# Patient Record
Sex: Female | Born: 1970 | Race: White | Hispanic: No | Smoking: Never smoker
Health system: Southern US, Community
[De-identification: ages and names within clinical notes are randomized; demographics above are authoritative.]

---

## 2004-07-10 ENCOUNTER — Emergency Department: Payer: Self-pay | Admitting: Emergency Medicine

## 2004-09-23 ENCOUNTER — Emergency Department: Payer: Self-pay | Admitting: Emergency Medicine

## 2004-10-19 ENCOUNTER — Emergency Department: Payer: Self-pay | Admitting: Internal Medicine

## 2004-10-24 ENCOUNTER — Emergency Department: Payer: Self-pay | Admitting: Emergency Medicine

## 2004-12-16 ENCOUNTER — Observation Stay: Payer: Self-pay | Admitting: Surgery

## 2005-11-21 ENCOUNTER — Emergency Department: Payer: Self-pay | Admitting: Emergency Medicine

## 2006-01-02 ENCOUNTER — Emergency Department: Payer: Self-pay | Admitting: Emergency Medicine

## 2006-08-06 IMAGING — US ABDOMEN ULTRASOUND
1 series · 17 of 25 positions shown · non-contrast
Comparison: none

REASON FOR EXAM: Abdominal pain
COMMENTS:

[Series 1: abdomen ultrasound · 17 of 51 slices shown]
[im 1/51]
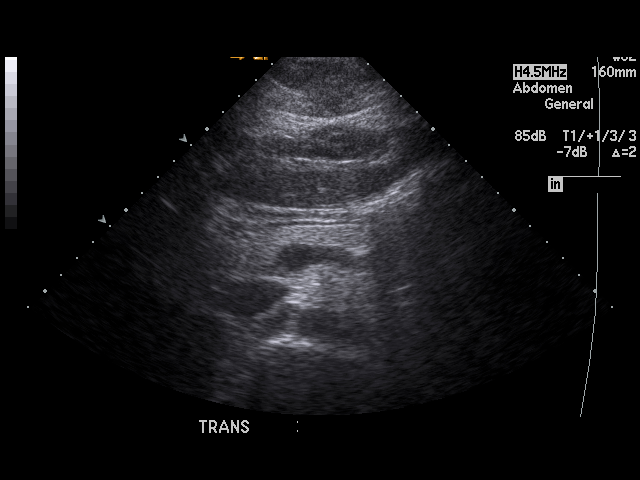
[im 5/51]
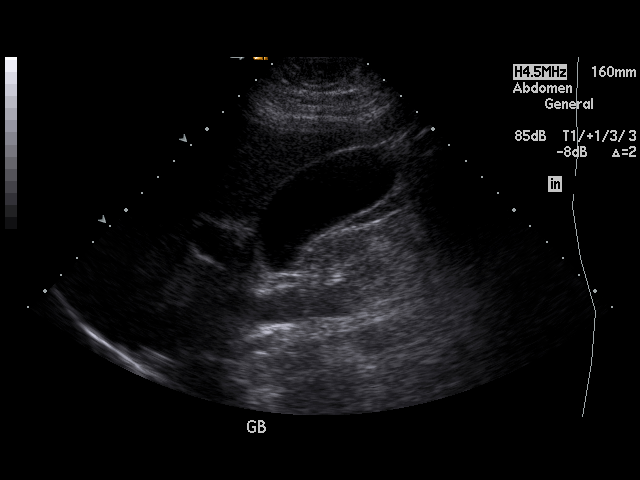
[im 7/51]
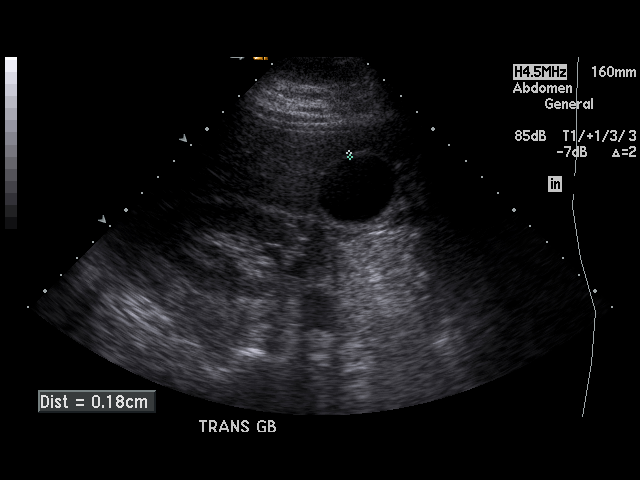
[im 11/51]
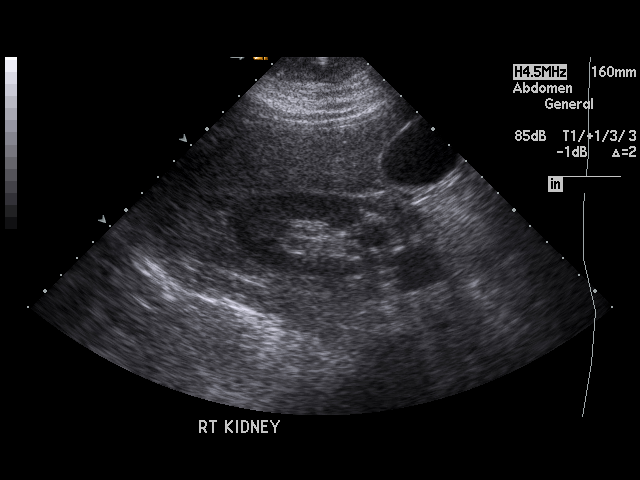
[im 13/51]
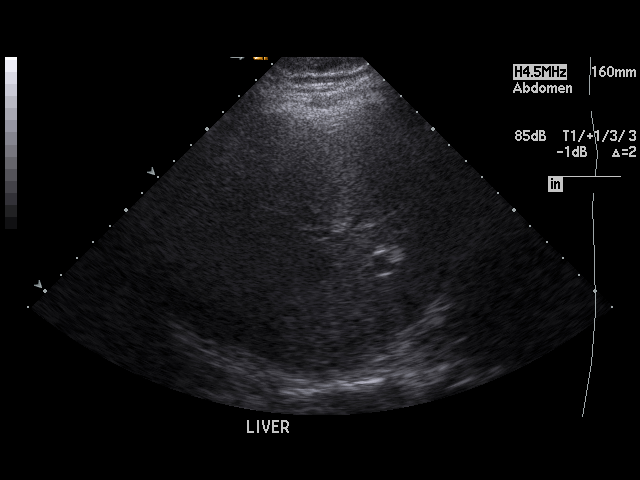
[im 17/51]
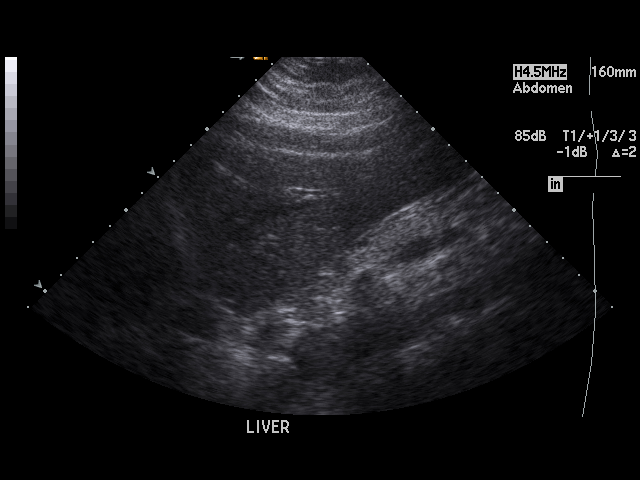
[im 19/51]
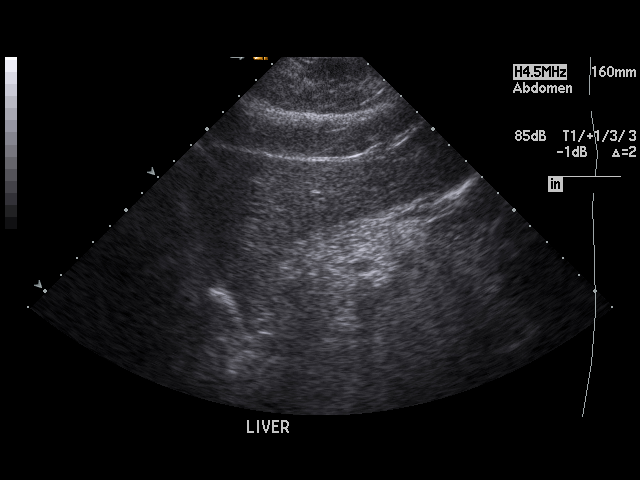
[im 23/51]
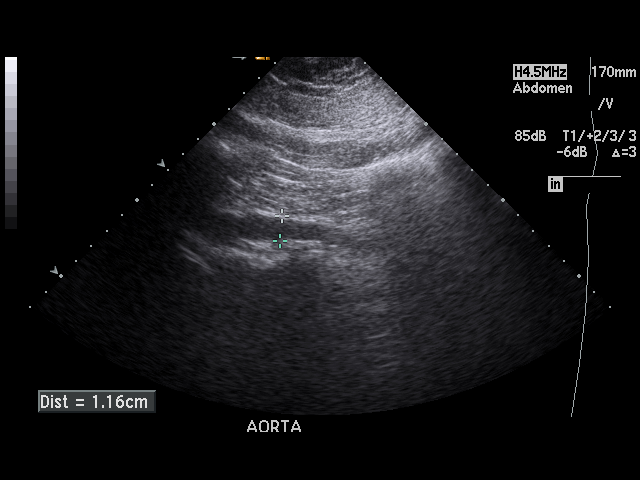
[im 26/51]
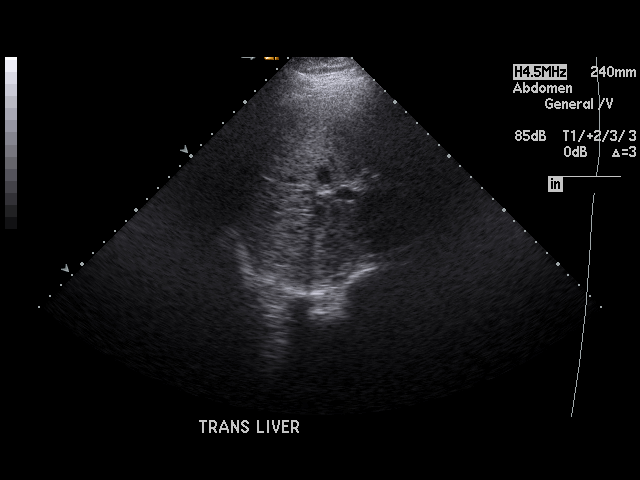
[im 28/51]
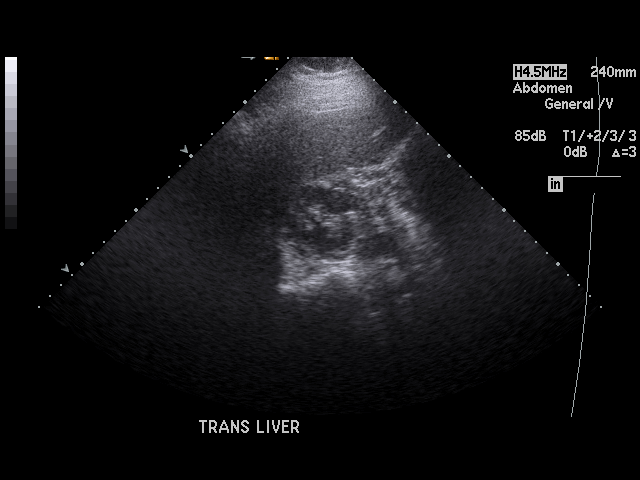
[im 32/51]
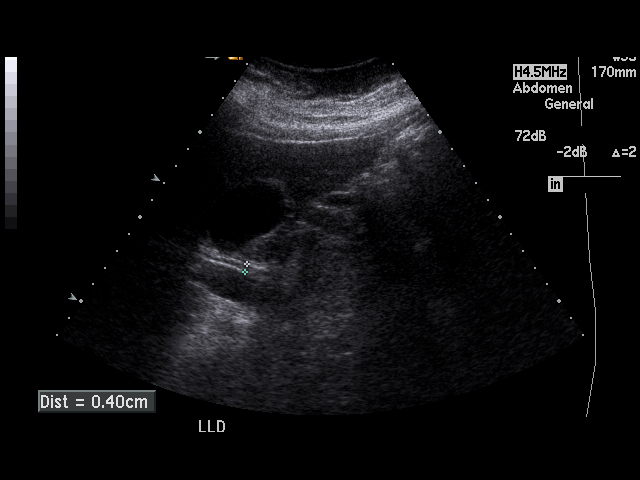
[im 34/51]
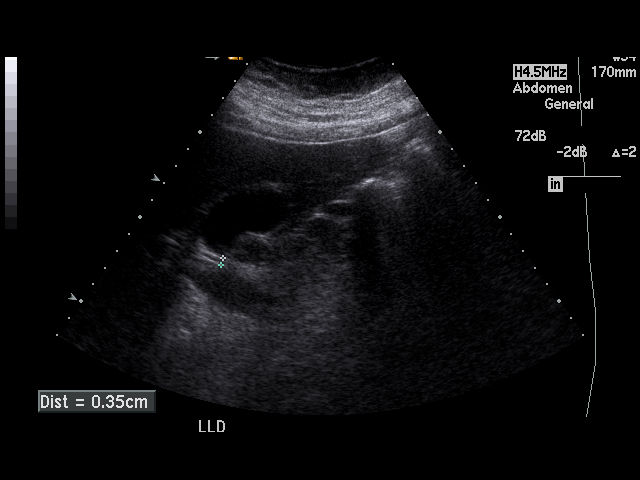
[im 38/51]
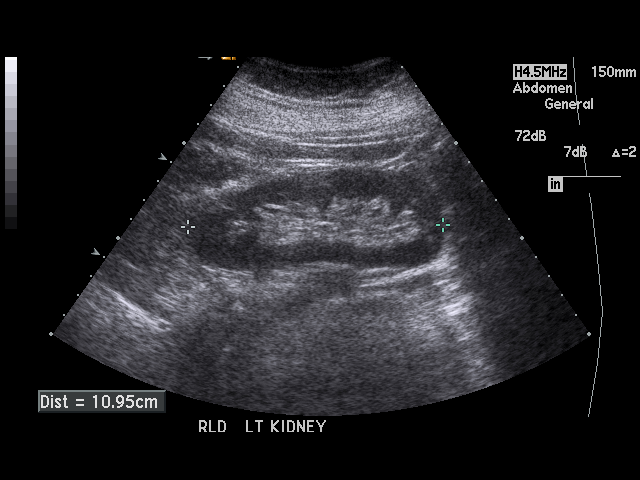
[im 40/51]
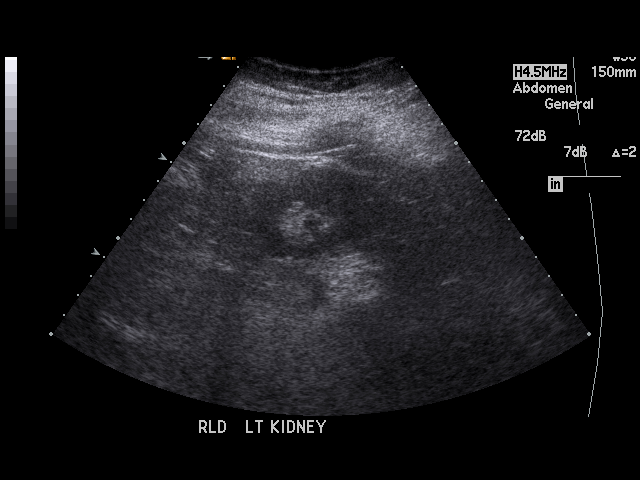
[im 44/51]
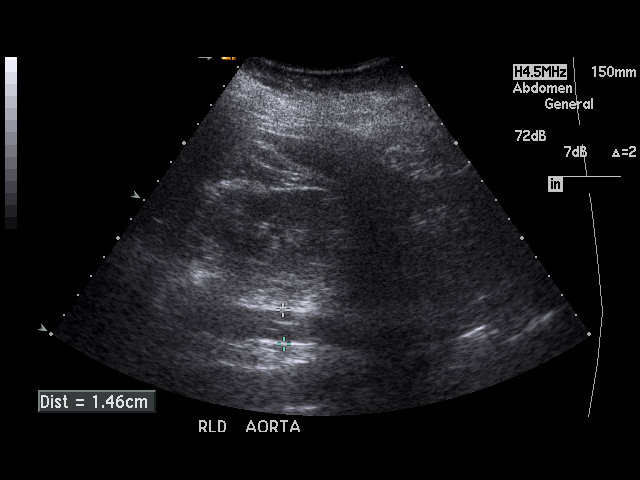
[im 46/51]
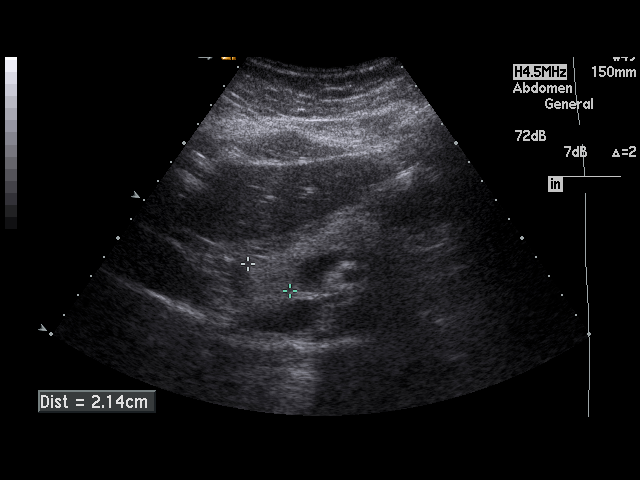
[im 51/51]
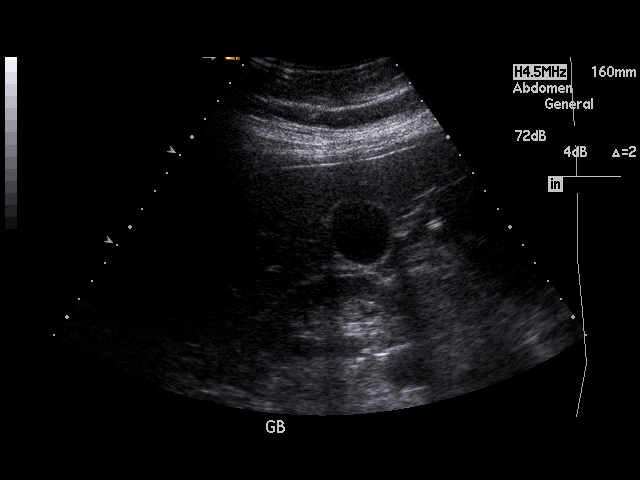

[17 of 25 positions shown; findings below may reference images not displayed]

PROCEDURE:     US  - US ABDOMEN GENERAL SURVEY  - January 02, 2006  [DATE]

RESULT:     The liver and pancreas are normal in appearance. The spleen is
enlarged. The spleen measures 15.07 cm x 5.83 cm. The abdominal aorta is
normal in appearance. No gallstones are seen. There is no thickening of the
gallbladder wall. The common bile duct measures 4.0 mm in diameter which is
within normal limits. The kidneys show no hydronephrosis. There is no
ascites.
IMPRESSION: No significant abnormalities are noted.

## 2020-04-26 ENCOUNTER — Ambulatory Visit (INDEPENDENT_AMBULATORY_CARE_PROVIDER_SITE_OTHER): Payer: Medicare Other | Admitting: Pulmonary Disease

## 2020-04-26 ENCOUNTER — Encounter: Payer: Self-pay | Admitting: Pulmonary Disease

## 2020-04-26 ENCOUNTER — Other Ambulatory Visit: Payer: Self-pay

## 2020-04-26 ENCOUNTER — Other Ambulatory Visit: Payer: Self-pay | Admitting: Pulmonary Disease

## 2020-04-26 DIAGNOSIS — R0683 Snoring: Secondary | ICD-10-CM

## 2020-04-26 NOTE — Progress Notes (Signed)
   Subjective:    Patient ID: Isabella Wood, female    DOB: May 04, 1971, 49 y.o.   MRN: [id number]  HPI   Chief Complaint  Patient presents with  . Sleep Consult    Pt c/o falling asleep easily. Has never had PSG done. She says she has been told that she snores. Epworth score = 20.    49 year old never smoker presents for evaluation of sleep disordered breathing, referred by her cardiologist. PMH -She has paroxysmal atrial fibrillation, sees a cardiologist at [facility], maintained on Cardizem. She has diabetes and she has done very well to control this and brought her HbA1c down from 13-5.7, off insulin and takes Ozempic daily and orals. She has lost 40 pounds with diet and exercise regimen from 267 to her current weight of 226 pounds. Hypertension, chronic headaches Abnormal mammogram which is being investigated  Epworth sleepiness score is 19 and she reports sleepiness and fatigue while sitting and reading, watching TV, as a passenger in a car or sitting quietly after lunch Boyfriend has noted loud snoring and she is hard to wake up sometimes. There is no history of sleepwalking but there have been occasions where she is screaming in her sleep. No other history is a history of acting out her dreams. Bedtime is between 9 and 10 PM, sleep latency is minimal, she sleeps on her side with 2 pillows because she cannot lie on her back, reports 3-4 nocturnal awakenings including nocturia and is out of bed by 5 AM feeling rested without dryness of mouth or headaches. 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 She has been a homemaker, divorced  Family history-cousin has OSA, heart disease in her father, emphysema  No past medical history on file.  History reviewed. No pertinent  surgical history.  '  Review of Systems  Shortness of breath on activity Irregular heartbeat Acid heartburn Headaches Earache Feet swelling Joint stiffness Admits to mental stressors especially related to diagnosis of breast nodule    Objective:   Physical Exam  Gen. Pleasant, obese, in no distress, normal affect ENT - no pallor,icterus, no post nasal drip, class 2 airway Neck: No JVD, no thyromegaly, no carotid bruits Lungs: no use of accessory muscles, no dullness to percussion, decreased without rales or rhonchi  Cardiovascular: Rhythm regular, heart sounds  normal, no murmurs or gallops, no peripheral edema Abdomen: soft and non-tender, no hepatosplenomegaly, BS normal. Musculoskeletal: No deformities, no cyanosis or clubbing Neuro:  alert, non focal, no tremors       Assessment & Plan:

## 2020-04-26 NOTE — Patient Instructions (Signed)
Home sleep study 

## 2020-04-26 NOTE — Assessment & Plan Note (Signed)
Given excessive daytime somnolence, narrow pharyngeal exam, witnessed apneas & loud snoring, obstructive sleep apnea is very likely & an overnight polysomnogram will be scheduled as a home study. The pathophysiology of obstructive sleep apnea , it's cardiovascular consequences & modes of treatment including CPAP were discused with the patient in detail & they evidenced understanding.  Pre test prob is intermediate , her weight loss has helped significantly. She would be agreeable to use CPAP if needed
